# Patient Record
Sex: Female | Born: 1965 | Race: White | Hispanic: No | Marital: Married | Smoking: Never smoker
Health system: Southern US, Community
[De-identification: ages and names within clinical notes are randomized; demographics above are authoritative.]

## PROBLEM LIST (undated history)

## (undated) DIAGNOSIS — I10 Essential (primary) hypertension: Secondary | ICD-10-CM

## (undated) DIAGNOSIS — L509 Urticaria, unspecified: Secondary | ICD-10-CM

## (undated) HISTORY — DX: Urticaria, unspecified: L50.9

## (undated) HISTORY — PX: OTHER SURGICAL HISTORY: SHX169

## (undated) HISTORY — PX: CHOLECYSTECTOMY: SHX55

## (undated) HISTORY — PX: APPENDECTOMY: SHX54

## (undated) HISTORY — DX: Essential (primary) hypertension: I10

---

## 1996-01-03 HISTORY — PX: AUGMENTATION MAMMAPLASTY: SUR837

## 2000-02-09 ENCOUNTER — Other Ambulatory Visit: Admission: RE | Admit: 2000-02-09 | Discharge: 2000-02-09 | Payer: Self-pay | Admitting: Obstetrics & Gynecology

## 2000-05-15 ENCOUNTER — Inpatient Hospital Stay (HOSPITAL_COMMUNITY): Admission: RE | Admit: 2000-05-15 | Discharge: 2000-05-16 | Payer: Self-pay | Admitting: Obstetrics & Gynecology

## 2000-05-15 ENCOUNTER — Encounter (INDEPENDENT_AMBULATORY_CARE_PROVIDER_SITE_OTHER): Payer: Self-pay | Admitting: Specialist

## 2008-12-15 ENCOUNTER — Encounter: Admission: RE | Admit: 2008-12-15 | Discharge: 2008-12-15 | Payer: Self-pay | Admitting: Obstetrics & Gynecology

## 2010-01-24 ENCOUNTER — Encounter
Admission: RE | Admit: 2010-01-24 | Discharge: 2010-01-24 | Payer: Self-pay | Source: Home / Self Care | Attending: Obstetrics & Gynecology | Admitting: Obstetrics & Gynecology

## 2010-05-20 NOTE — Discharge Summary (Signed)
[facility]  Patient:    Heather Bishop, Heather Bishop                     MRN: [id number] Adm. Date:  [id number] Disc. Date: 05/16/00 Attending:  Mickle Mallory                           Discharge Summary  DISCHARGE DIAGNOSES:          1. Symptomatic uterine prolapse.                               2. Menorrhagia.  PROCEDURES:                   Total vaginal hysterectomy.  SUMMARY:                      This patient was admitted after total vaginal hysterectomy which was carried out on May 15, 2000. The procedure was done for menorrhagia and symptomatic uterine prolapse. Operative procedure went without incident. The patients postoperative course was benign. Discharge hemoglobin 11.1 with a white count of 7500 and a platelet count of 219,000. On the evening of May 15, the patient was afebrile, ambulating well, tolerating a regular diet well, having normal bowel and bladder function, was afebrile, and was deemed ready for discharge. Accordingly, she was given all the appropriate instructions and understood instructions well.  DISCHARGE MEDICATIONS:        1. Motrin 600 mg every six hours.                               2. Tylox one to two every four to six hours as                                  needed.  DISCHARGE FOLLOWUP:           She will return to the office for followup in approximately three weeks time.  PATHOLOGY:                    Pathologic specimen was not available at the time of discharge.  CONDITION ON DISCHARGE:       Improved. DD:  05/16/00 TD:  05/16/00 Job: [id number]

## 2010-05-20 NOTE — H&P (Signed)
[facility]  Patient:    Heather Bishop                           MRN: [id number] Adm. Date:  05/15/00 Attending:  Gerrit Friends. Aldona Bar, M.D.                         History and Physical  OFFICE NUMBER                 [id number]  The patient is being admitted after surgery at [facility] on the morning of May 15, 2000.  Please try to have this History & Physical in the Day Surgery Area by 7 a.m. May 15, 2000.  HISTORY:                      Heather Bishop is a 45 year old gravida 3, para 3, married white female admitted with symptomatic prolapse and menorrhagia for a total vaginal hysterectomy.  The patients last menstrual period was April 20.  She has regular cyclic menses, but her menses are characteristically very heavy, and most recently she has had some symptomatic pelvic pressure, especially while exercising and is desirous of surgical correction of her symptomatic prolapse and her menorrhagia.  She is being admitted at this time after a total vaginal hysterectomy.  She had her first delivery by cesarean section in 1990.  At that time, she developed HELLP syndrome.  Her second and third pregnancies were delivered vaginally without difficulty.  Her cervical cytology was normal as of February 2002.  PAST MEDICAL HISTORY:         Negative with the exception of above.  ALLERGIES:                    The patient has no known allergies.  HABITS:                       She is a nonsmoker.  MEDICATIONS:                  She is currently on no medications.  PAST SURGICAL HISTORY:        Surgical procedures includes only the cesarean section in 1990.  SOCIAL HISTORY, FAMILY HISTORY, REVIEW OF SYSTEMS:  Negative with the exception of above.  PHYSICAL EXAMINATION AT TIME OF ADMISSION:  GENERAL:                      Well-developed female, no distress.  VITAL SIGNS:                  Height 5 feet 2 inches, weight 127.  Blood pressure 118/76, temperature  98.2, pulse 80 and regular, respirations 16 and regular.  HEENT:                        Negative.  NECK:                         Thyroid not enlarged.  CHEST:                        Clear.  CARDIOVASCULAR:               Without murmur.  BREASTS:  Negative, implants.  ABDOMEN:                      Unremarkable.  Well-healed cesarean section scar.  PELVIC:                       External genitalia, BUS normal, introitus by digital vault.  There is some first degree to second degree uterine prolapse. Uterus is normal size, mobile.  Cervix has no gross lesions.  Adnexal area negative.  Rectovaginal exam confirmatory.  EXTREMITIES:                  Negative.  NEUROLOGIC:                   Physiologic.  IMPRESSION:                   Symptomatic uterine prolapse and menorrhagia.  PLAN:                         Total vaginal hysterectomy. DD:  05/14/00 TD:  05/14/00 Job: [id number]

## 2010-05-20 NOTE — Op Note (Signed)
[facility]  Patient:    Heather Bishop, Heather Bishop                     MRN: [id number] Proc. Date: 05/15/00 Adm. Date:  [id number] Attending:  Mickle Mallory                           Operative Report  PREOPERATIVE DIAGNOSIS:       Menorrhagia and prolapse.  POSTOPERATIVE DIAGNOSIS:      Menorrhagia and prolapse, pathology pending.  PROCEDURE:                    Total vaginal hysterectomy.  SURGEON:                      Gerrit Friends. Aldona Bar, M.D.  ASSISTANT:                    Luvenia Redden, M.D.  ANESTHESIA:                   General endotracheal.  DESCRIPTION OF PROCEDURE:     The patient was taken to the operating room and, after satisfactory induction of general endotracheal anesthesia, she was prepped and draped, having been placed in the modified lithotomy position.  At this point, the bladder was drained of clear urine with a red rubber catheter in in-and-out fashion.  After the patient was adequately prepped and draped, the procedure was begun.  A weighted speculum was placed in the vagina and a tenaculum placed on the cervix.  The cervix was then circumferentially infiltrated with dilute solution of Neo-Synephrine to aid in hemostasis.  Thereafter, using a scalpel, the cervix was circumscribed and vaginal tissue pushed off the cervi circumferentially.  The posterior peritoneum was identified and entered appropriately.  At this time, the uterosacral pedicles were clamped, cut and sutured superiorly with 0 Vicryl suture.  Additional pedicles were taken in similar fashion above the uterosacrals.  Thereafter, the anterior peritoneum was identified and entered without difficulty.  The uterine artery pedicles were then clamped, cut and sutured superiorly with 0 Vicryl suture. Additional parametrial pedicles were taken bilaterally up to the point where the uterus could be inverted without difficulty.  The uterus was inverted and the ovarian pedicles  were clamped, cut and doubly suture secured with 0 Vicryl sutures.  The specimen was removed.  At this time, all pedicles were investigated.  There was some bleeding above the sutures on the left ovarian pedicle.  These were secured with figure-of-eight 0 Vicryl suture.  Again, the pedicles were inspected and noted to be dry.  At this time, the posterior peritoneum was rendered hemostatic with a running locking suture of 0 Vicryl from uterosacral to uterosacral.  Thereafter, the peritoneum was pursestringed after the bladder was drained of clear urine with a red rubber catheter.  The pursestring was 0 Vicryl suture and essentially closed the peritoneal cavity after peritonealizing all pedicles except the ovarians, which were cut free.  At this time, the vaginal cuff was closed in a vertical fashion with 0 Vicryl suture in interrupted fashion.  Hemostasis was noted to be excellent.  At this time, the bladder was drained again of clear urine with a red rubber catheter and the procedure was felt to be complete and was terminated.  No packs or catheters were left in.  The patient at this time was transported  to the recovery room in satisfactory condition after tolerating the procedure well.  All counts were correct x 2. Both ovaries appeared normal.  The specimen itself had what appeared to be a few myomas, but otherwise was benign.  Further pathologic analysis pending.  At the completion of the procedure, the patient was in satisfactory condition and was taken to recovery in good condition. DD:  05/15/00 TD:  05/15/00 Job: [id number]

## 2011-02-13 ENCOUNTER — Other Ambulatory Visit: Payer: Self-pay | Admitting: Obstetrics & Gynecology

## 2011-02-13 DIAGNOSIS — Z1231 Encounter for screening mammogram for malignant neoplasm of breast: Secondary | ICD-10-CM

## 2011-02-28 ENCOUNTER — Ambulatory Visit
Admission: RE | Admit: 2011-02-28 | Discharge: 2011-02-28 | Disposition: A | Discharge status: Home / Self Care | Payer: Commercial Managed Care - PPO | Source: Ambulatory Visit | Attending: Obstetrics & Gynecology | Admitting: Obstetrics & Gynecology

## 2011-02-28 DIAGNOSIS — Z1231 Encounter for screening mammogram for malignant neoplasm of breast: Secondary | ICD-10-CM

## 2012-03-11 ENCOUNTER — Other Ambulatory Visit: Payer: Self-pay

## 2012-03-11 DIAGNOSIS — Z1231 Encounter for screening mammogram for malignant neoplasm of breast: Secondary | ICD-10-CM

## 2012-04-02 ENCOUNTER — Ambulatory Visit
Admission: RE | Admit: 2012-04-02 | Discharge: 2012-04-02 | Disposition: A | Discharge status: Home / Self Care | Payer: PRIVATE HEALTH INSURANCE | Source: Ambulatory Visit

## 2012-04-02 DIAGNOSIS — Z1231 Encounter for screening mammogram for malignant neoplasm of breast: Secondary | ICD-10-CM

## 2013-09-17 ENCOUNTER — Other Ambulatory Visit: Payer: Self-pay | Admitting: Obstetrics & Gynecology

## 2013-09-17 DIAGNOSIS — R928 Other abnormal and inconclusive findings on diagnostic imaging of breast: Secondary | ICD-10-CM

## 2013-09-24 ENCOUNTER — Ambulatory Visit
Admission: RE | Admit: 2013-09-24 | Discharge: 2013-09-24 | Disposition: A | Discharge status: Home / Self Care | Payer: 59 | Source: Ambulatory Visit | Attending: Obstetrics & Gynecology | Admitting: Obstetrics & Gynecology

## 2013-09-24 ENCOUNTER — Encounter (INDEPENDENT_AMBULATORY_CARE_PROVIDER_SITE_OTHER): Payer: Self-pay

## 2013-09-24 DIAGNOSIS — R928 Other abnormal and inconclusive findings on diagnostic imaging of breast: Secondary | ICD-10-CM

## 2013-11-09 IMAGING — MG MM DIGITAL SCREENING W/ IMPLANTS
9 of 12 series · 9 of 28 positions shown · non-contrast
Comparison: Previous exams.

CLINICAL DATA: Screening.

[L MLO (1 of 2)]
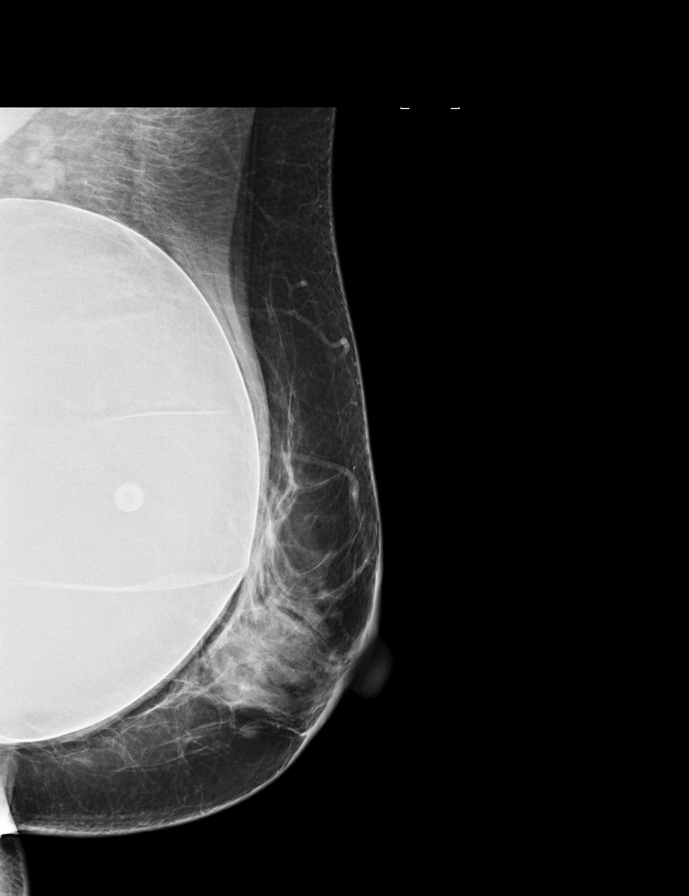

[R MLO (1 of 2)]
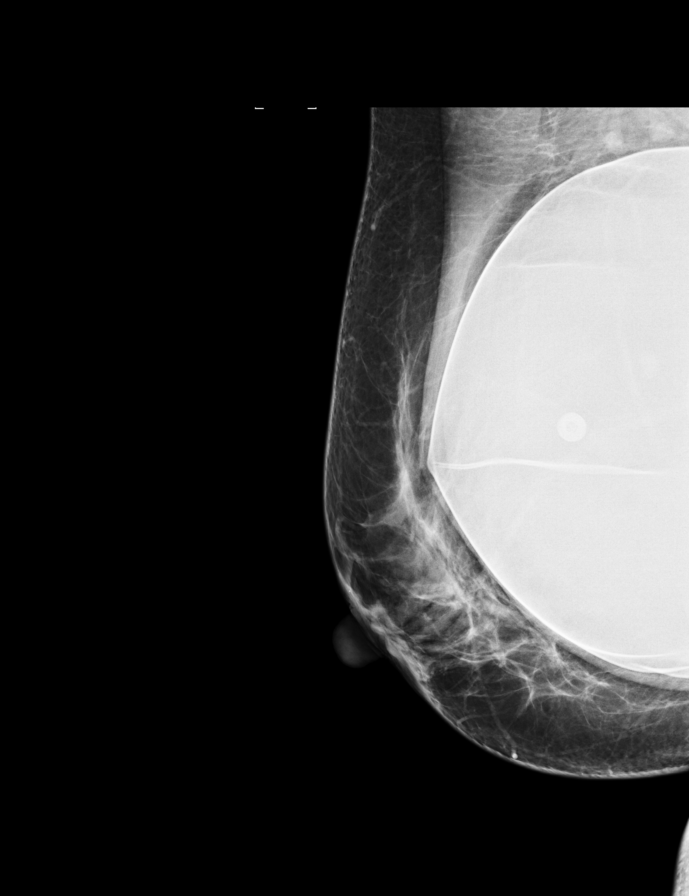

[L CC (1 of 2)]
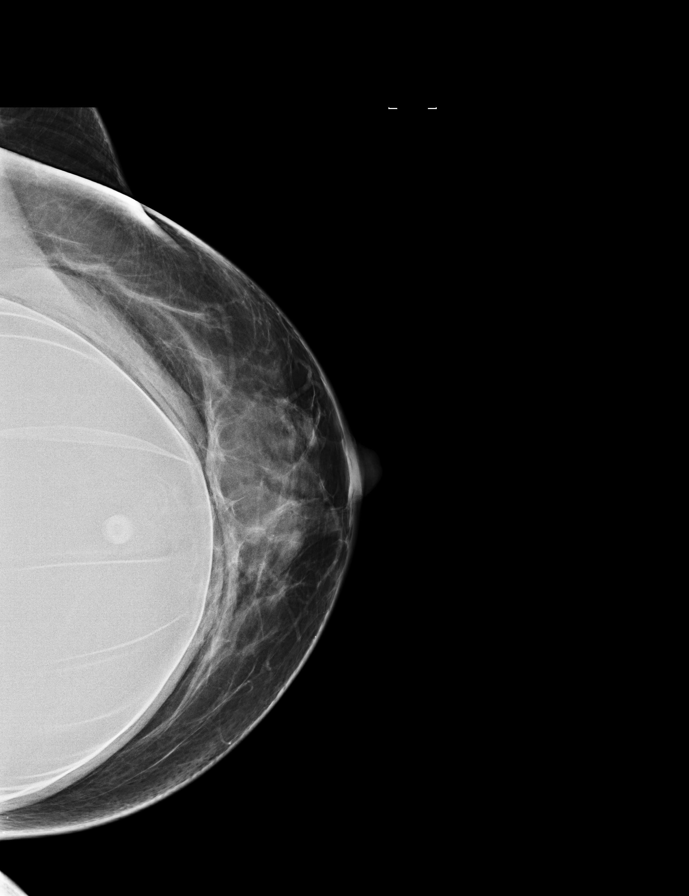

[R CC (1 of 2)]
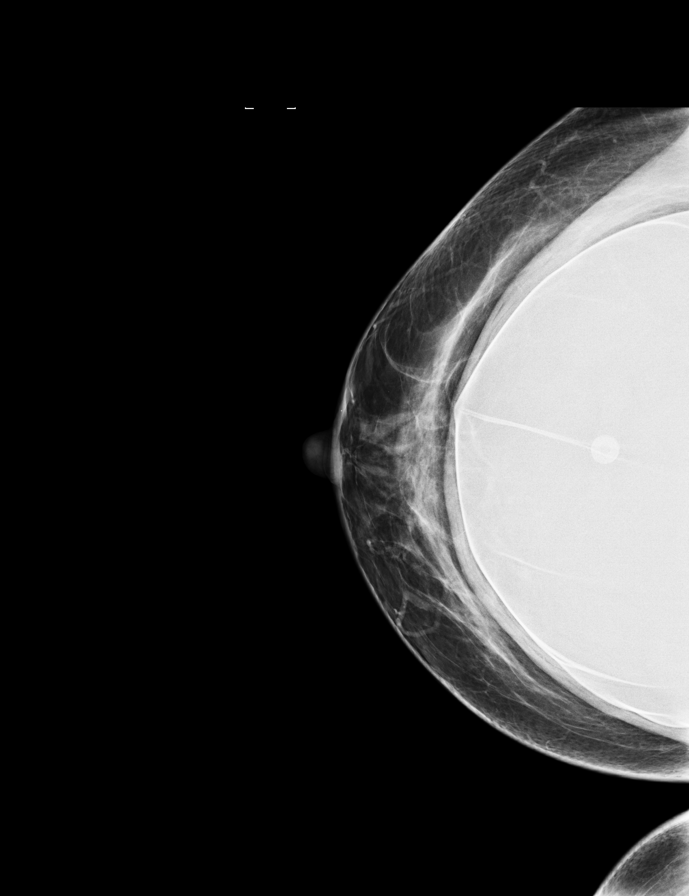

[L MLO (2 of 2)]
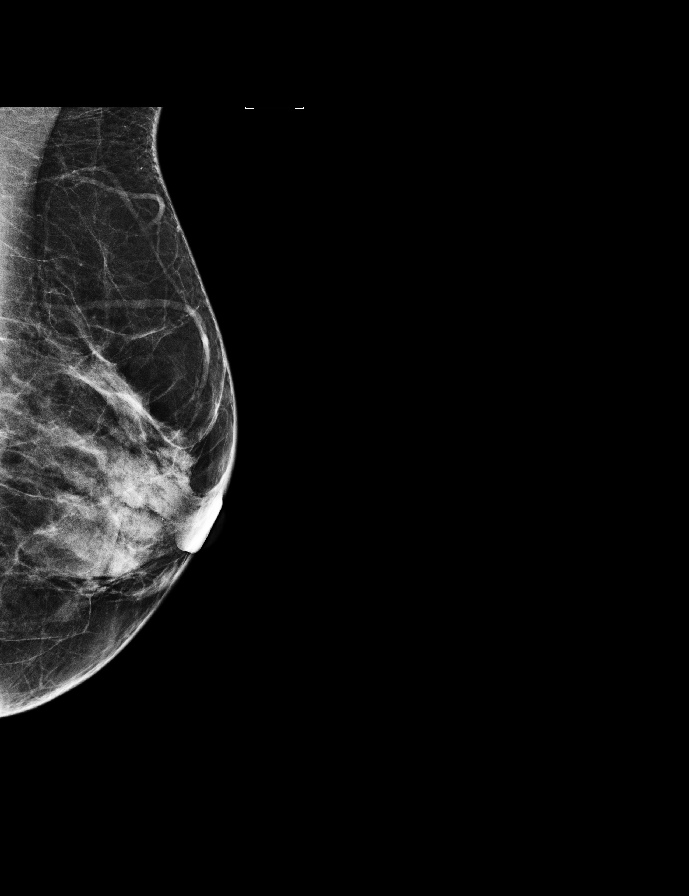

[L CC (2 of 2)]
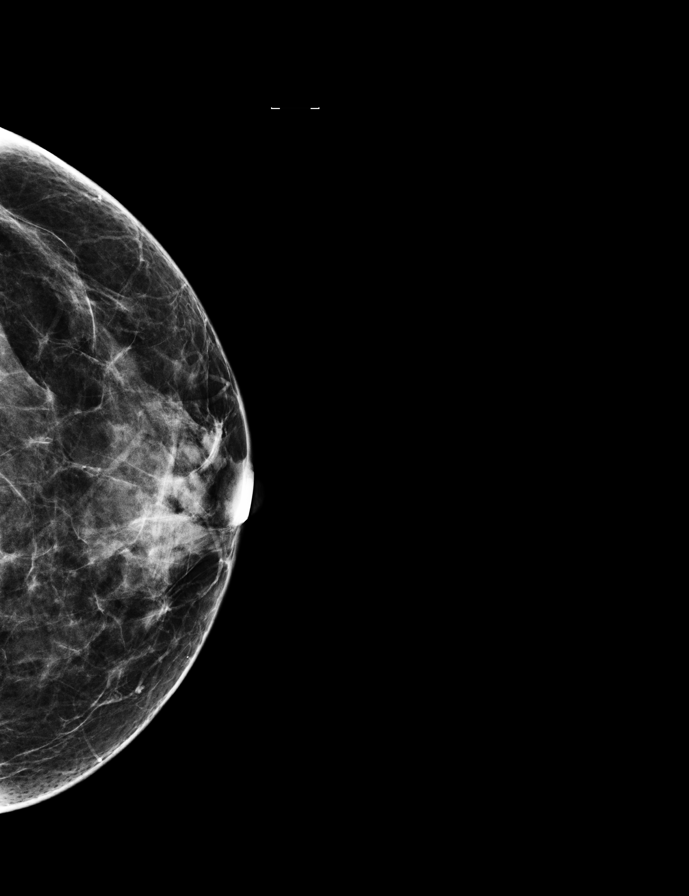

[R MLO (2 of 2)]
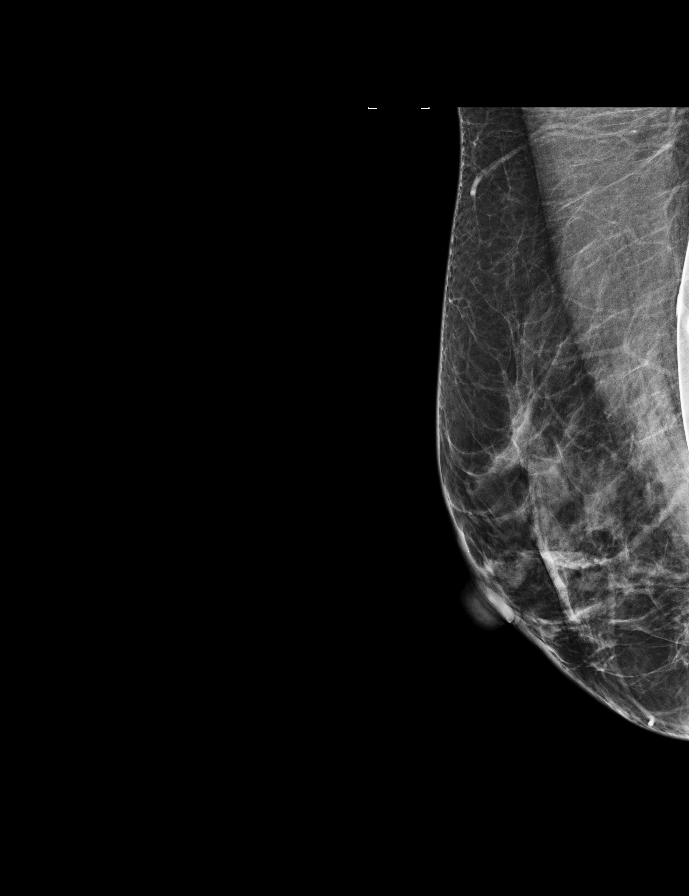

[R CC (2 of 2)]
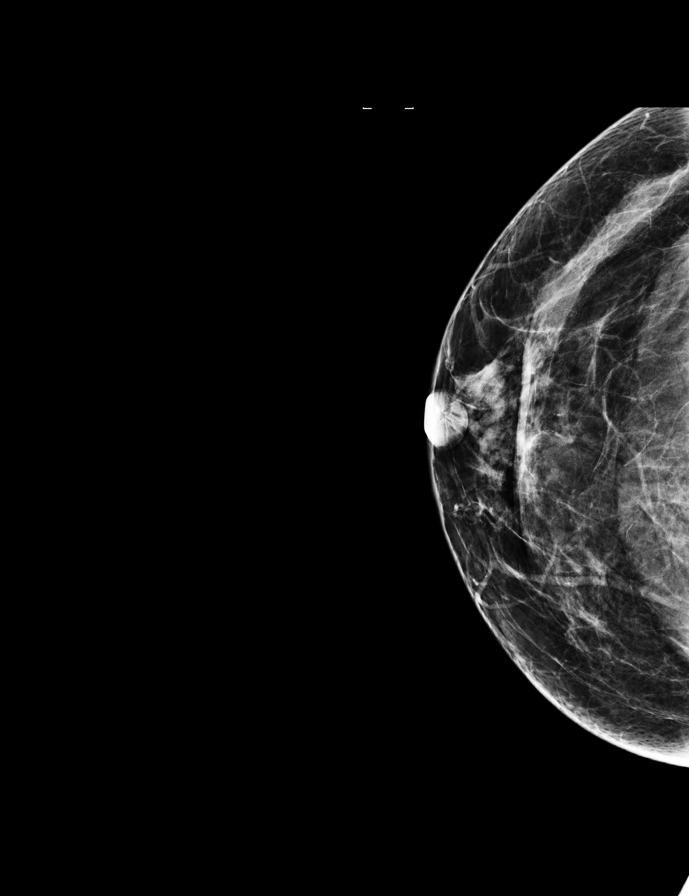

[R MLO tomo · tomo slice 29/56.0]
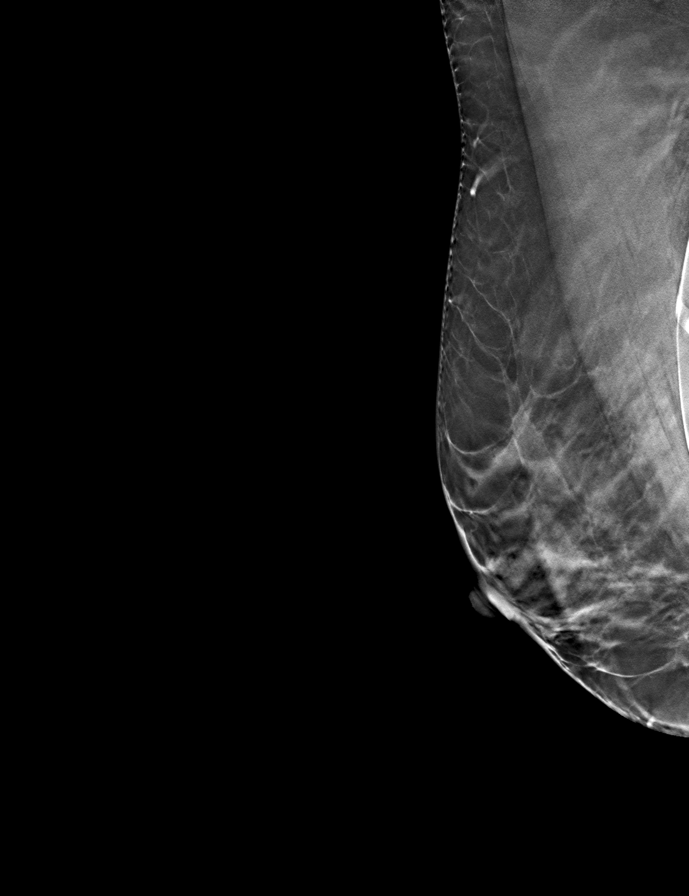

[9 of 28 positions shown; findings below may reference images not displayed]

DIGITAL SCREENING MAMMOGRAM WITH IMPLANTS AND CAD
DIGITAL BREAST TOMOSYNTHESIS

Digital breast tomosynthesis images are acquired in two
projections.  These images are reviewed in combination with the
digital mammogram, confirming the findings below.

The patient has bilateral subpectoral saline implants. Standard and
implant displaced views were performed.
FINDINGS: ACR Breast Density Category 2: There is a scattered fibroglandular
pattern.

No suspicious masses, architectural distortion, or calcifications
are present.

Images were processed with CAD.
IMPRESSION: No mammographic evidence of malignancy.

A result letter of this screening mammogram will be mailed directly
to the patient.

RECOMMENDATION:
Screening mammogram in one year. (Code:NS-M-P7J)

BI-RADS CATEGORY 1:  Negative.

## 2015-05-03 IMAGING — MG MM DIAGNOSTIC UNILATERAL L
2 series · 2 of 2 positions shown · non-contrast
Comparison: 09/15/2013 screening mammogram and prior mammograms
dating back to 12/15/2008

CLINICAL DATA: 48-year-old female with possible left breast
asymmetry on screening mammogram.

EXAM:
DIGITAL DIAGNOSTIC  LEFT MAMMOGRAM WITH CAD

[L CC]
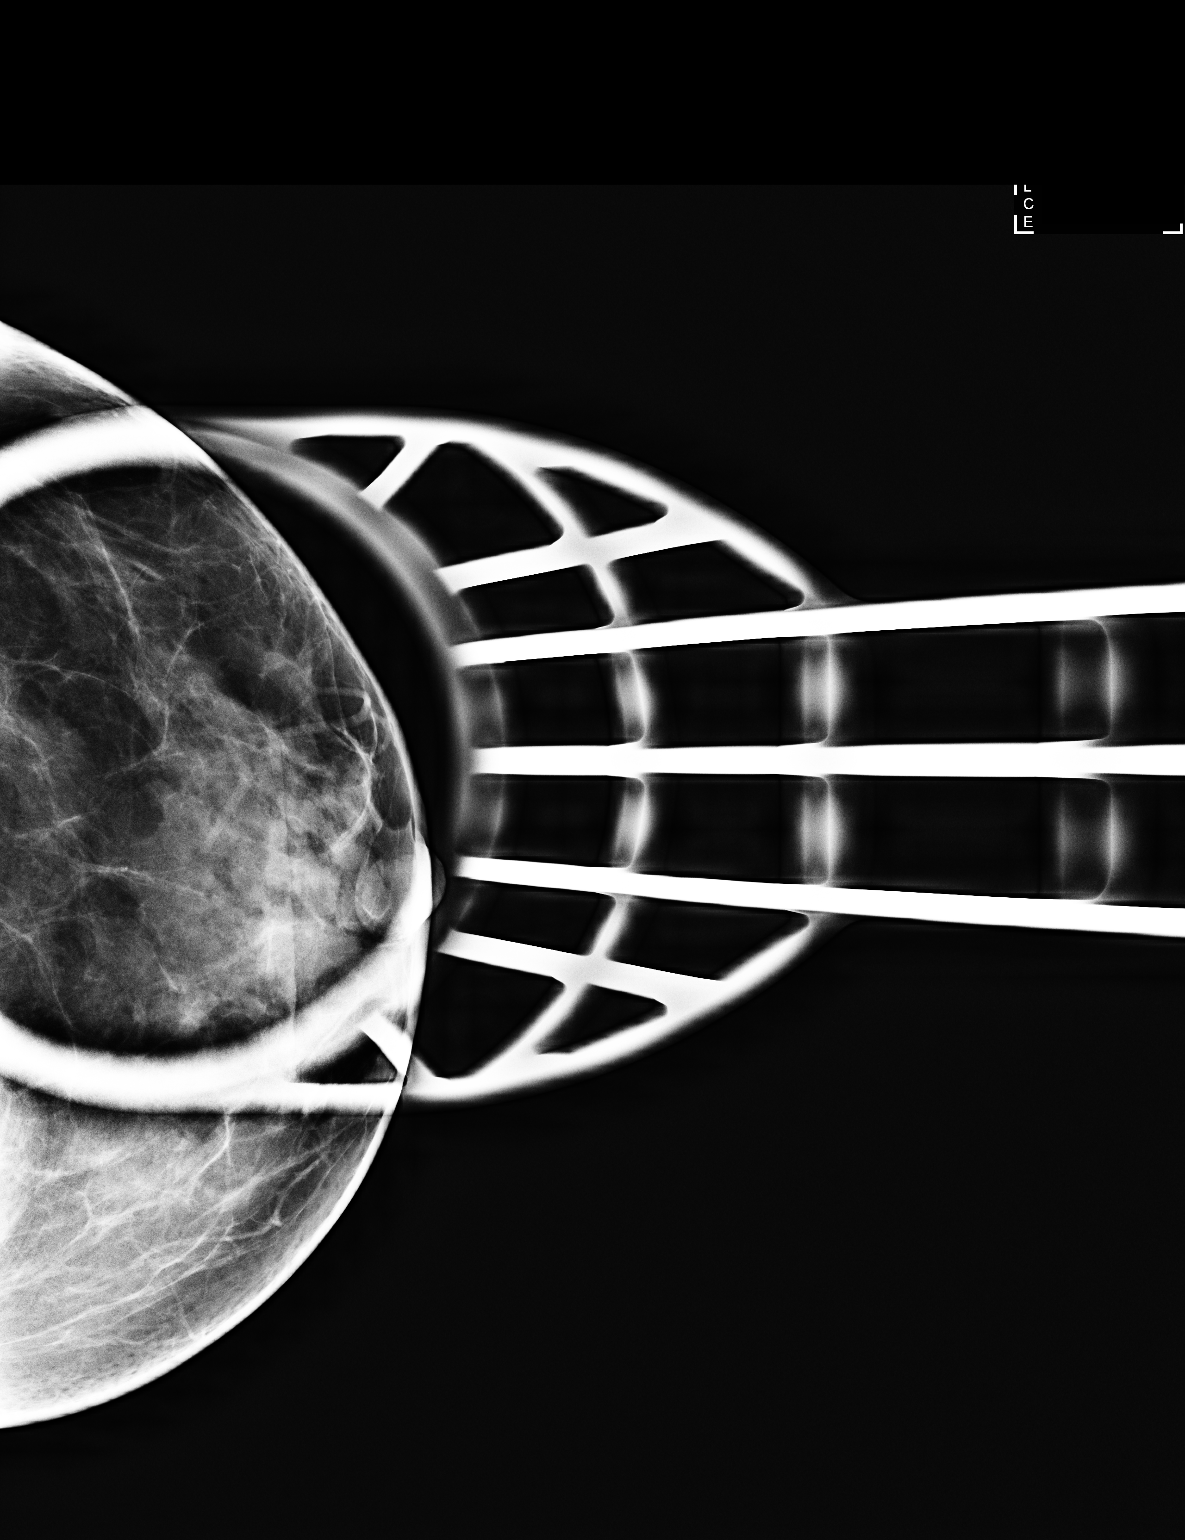

[L ML]
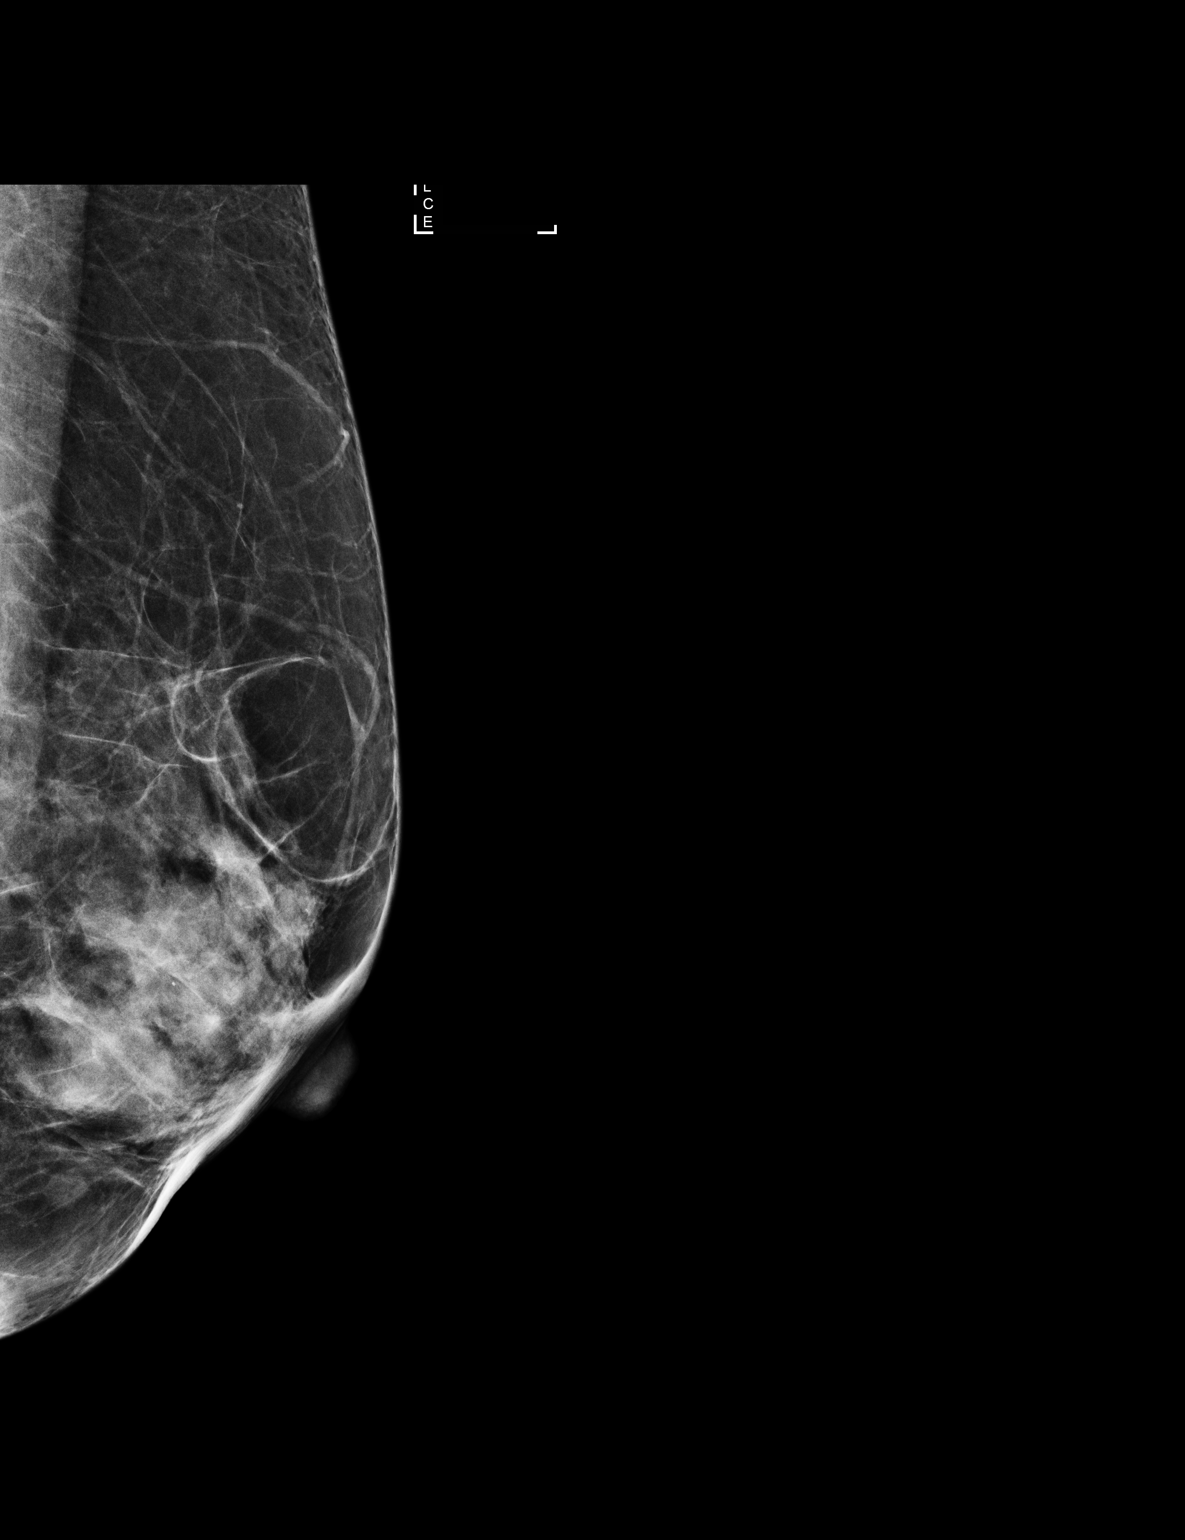

[2 of 2 positions shown; findings below may reference images not displayed]

ACR Breast Density Category c: The breast tissue is heterogeneously
dense, which may obscure small masses.
FINDINGS: CC spot compression and ML views of the left breast demonstrate no
persistent mass, distortion or worsen calcifications in the area of
the screening study finding. No persistent asymmetry is identified.

Mammographic images were processed with CAD.
IMPRESSION: No persistent abnormality in the area of the screening study
finding.

RECOMMENDATION:
Bilateral screening mammograms in 1 year.

I have discussed the findings and recommendations with the patient.
Results were also provided in writing at the conclusion of the
visit. If applicable, a reminder letter will be sent to the patient
regarding the next appointment.

BI-RADS CATEGORY  1: Negative

## 2019-11-05 LAB — COLOGUARD: COLOGUARD: NEGATIVE

## 2021-08-23 ENCOUNTER — Other Ambulatory Visit: Payer: Self-pay | Admitting: Obstetrics & Gynecology

## 2021-08-23 DIAGNOSIS — Z1231 Encounter for screening mammogram for malignant neoplasm of breast: Secondary | ICD-10-CM

## 2021-09-13 ENCOUNTER — Ambulatory Visit
Admission: RE | Admit: 2021-09-13 | Discharge: 2021-09-13 | Disposition: A | Discharge status: Home / Self Care | Payer: No Typology Code available for payment source | Source: Ambulatory Visit | Attending: Obstetrics & Gynecology | Admitting: Obstetrics & Gynecology

## 2021-09-13 DIAGNOSIS — Z1231 Encounter for screening mammogram for malignant neoplasm of breast: Secondary | ICD-10-CM

## 2022-02-01 ENCOUNTER — Encounter: Payer: Self-pay | Admitting: Allergy and Immunology

## 2022-02-01 ENCOUNTER — Ambulatory Visit (INDEPENDENT_AMBULATORY_CARE_PROVIDER_SITE_OTHER): Payer: Managed Care, Other (non HMO) | Admitting: Allergy and Immunology

## 2022-02-01 VITALS — BP 110/76 | HR 71 | Resp 16 | Ht 62.0 in | Wt 146.8 lb

## 2022-02-01 DIAGNOSIS — Z889 Allergy status to unspecified drugs, medicaments and biological substances status: Secondary | ICD-10-CM | POA: Diagnosis not present

## 2022-02-01 DIAGNOSIS — J3089 Other allergic rhinitis: Secondary | ICD-10-CM

## 2022-02-01 DIAGNOSIS — L500 Allergic urticaria: Secondary | ICD-10-CM | POA: Diagnosis not present

## 2022-02-01 DIAGNOSIS — T7840XA Allergy, unspecified, initial encounter: Secondary | ICD-10-CM | POA: Diagnosis not present

## 2022-02-01 DIAGNOSIS — T50905A Adverse effect of unspecified drugs, medicaments and biological substances, initial encounter: Secondary | ICD-10-CM

## 2022-02-01 MED ORDER — EPINEPHRINE 0.3 MG/0.3ML IJ SOAJ: 0.3000 mg | INTRAMUSCULAR | 1 refills | Status: AC | PRN

## 2022-02-01 NOTE — Patient Instructions (Addendum)
  1.  Allergen avoidance measures???  2.  Continue famotidine 40 mg + cetirizine 10 mg 1 time per day  3.  EpiPen, Benadryl, MD/ER evaluation for allergic reaction  4.  Blood -alpha gal panel  5.  Further evaluation?  Yes, if with recurrent reactions  6.  Can use nasal steroid preventatively + antihistamine for nasal allergies  7.  Can use nasal saline during viral respiratory tract infections  8.  Return to clinic in 1 year or earlier if problem  9. Remain away from "Floxins"

## 2022-02-01 NOTE — Progress Notes (Unsigned)
[place] [facility] [place] [facility] [place] [facility] [place]   Dear Heather Bishop,  Thank you for referring Heather Bishop to the [facility] on 02/01/2022.   Below is a summation of this patient's evaluation and recommendations.  Thank you for your referral. I will keep you informed about this patient's response to treatment.   If you have any questions please do not hesitate to contact me.   Sincerely,  Heather Prows, MD Allergy / Immunology [place]   ______________________________________________________________________    NEW PATIENT NOTE  Referring Provider: Janalyn Rouse, FNP Primary Provider: Greig Right, MD Date of office visit: 02/01/2022    Subjective:   Chief Complaint:  Heather Bishop (DOB: 1965-12-24) is a 57 y.o. female who presents to the clinic on 02/01/2022 with a chief complaint of Urticaria .     HPI: Heather Bishop to this clinic in evaluation of her urticaria.  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  These episodes will last about 4 days and when they heal they heal without any scar hyperpigmentation.  They are very intermittent and she can go a month without these issues.  They were becoming more progressive but about 3 months ago she started a combination of famotidine and Zyrtec which really works very well and since that point time she has not had any recurrent episodes of urticaria.  However, about 3 weeks ago she had a viral respiratory tract infection that may have been complicated by otitis media and she was given moxifloxacin within 4 hours of that initial pill she developed very severe lip and eye swelling and some throat tightness and hives then she went to the emergency room and received epinephrine.  This reaction associated with  taking moxifloxacin was very unusual compared to her previous episodes.  She also has a history of nasal congestion and sneezing and eye involvement throughout the year with predilection for winter flare and occasionally spring flare.  She will take Zyrtec which does help.  She gets a sinus infection about 1 time per year.  She has ankylosing spondylitis/spondyloarthropathy treated by [facility] rheumatology currently with anti-TNF monoclonal antibody.  She has been utilizing some form of immunosuppressive drugs since 2020 with good results.  Past Medical History:  Diagnosis Date   Hypertension    Urticaria     Past Surgical History:  Procedure Laterality Date   APPENDECTOMY     AUGMENTATION MAMMAPLASTY Bilateral 1998   CHOLECYSTECTOMY     Bishop knee arthroscopy      Allergies as of 02/01/2022       Reactions   Penicillins Anaphylaxis        Medication List    cetirizine 10 MG tablet Commonly known as: ZYRTEC Take 10 mg by mouth daily.   estradiol 2 MG tablet Commonly known as: ESTRACE Take 2 mg by mouth daily.   famotidine 40 MG tablet Commonly known as: PEPCID Take 40 mg by mouth daily.   losartan 50 MG tablet Commonly known as: COZAAR Take 50 mg by mouth daily.   Multi Vitamin Tabs 1 tablet Orally Once a day for 30 day(s)   pantoprazole 40 MG tablet Commonly known as: PROTONIX Take 40 mg by mouth daily.   rosuvastatin 20 MG tablet Commonly known as: CRESTOR  Take 20 mg by mouth daily.   Simponi Aria 50 MG/4ML Soln injection Generic drug: golimumab Simponi ARIA 12.5 mg/mL intravenous solution   triamterene-hydrochlorothiazide 37.5-25 MG tablet Commonly known as: MAXZIDE-25 Take 1 tablet by mouth daily.   Valtrex 1000 MG tablet Generic drug: valACYclovir Take 1,000 mg by mouth 2 (two) times daily. Takes when she gets dental work done    Review of systems negative except as noted in HPI / PMHx or noted below:  Review of Systems  Constitutional:  Negative.   HENT: Negative.    Eyes: Negative.   Respiratory: Negative.    Cardiovascular: Negative.   Gastrointestinal: Negative.   Genitourinary: Negative.   Musculoskeletal: Negative.   Skin: Negative.   Neurological: Negative.   Endo/Heme/Allergies: Negative.   Psychiatric/Behavioral: Negative.      History reviewed. No pertinent family history.  Social History   Socioeconomic History   Marital status: Married    Spouse name: Not on file   Number of children: Not on file   Years of education: Not on file   Highest education level: Not on file  Occupational History   Not on file  Tobacco Use   Smoking status: Never   Smokeless tobacco: Never  Substance and Sexual Activity   Alcohol use: Never   Drug use: Not on file   Sexual activity: Not on file  Other Topics Concern   Not on file  Social History Narrative   Not on file   Environmental and Social history  Lives in a house with a dry environment, no animals located inside the household, no carpet in the bedroom, no plastic on the bed, no plastic on the pillow, no smoking ongoing with inside the household.  Objective:   Vitals:   02/01/22 1422  BP: 110/76  Pulse: 71  Resp: 16  SpO2: 97%   Height: 5\' 2"  (157.5 cm) Weight: 146 lb 12.8 oz (66.6 kg)  Physical Exam Constitutional:      Appearance: She is not diaphoretic.  HENT:     Head: Normocephalic.     Bishop Ear: Tympanic membrane, ear canal and external ear normal.     Left Ear: Tympanic membrane, ear canal and external ear normal.     Nose: Nose normal. No mucosal edema or rhinorrhea.     Mouth/Throat:     Pharynx: Uvula midline. No oropharyngeal exudate.  Eyes:     Conjunctiva/sclera: Conjunctivae normal.  Neck:     Thyroid: No thyromegaly.     Trachea: Trachea normal. No tracheal tenderness or tracheal deviation.  Cardiovascular:     Rate and Rhythm: Normal rate and regular rhythm.     Heart sounds: Normal heart sounds, S1 normal and S2  normal. No murmur heard. Pulmonary:     Effort: No respiratory distress.     Breath sounds: Normal breath sounds. No stridor. No wheezing or rales.  Lymphadenopathy:     Head:     Bishop side of head: No tonsillar adenopathy.     Left side of head: No tonsillar adenopathy.     Cervical: No cervical adenopathy.  Skin:    Findings: No erythema or rash.     Nails: There is no clubbing.  Neurological:     Mental Status: She is alert.     Diagnostics: Allergy skin tests were performed.   Spirometry was performed and demonstrated an FEV1 of *** @ *** % of predicted. FEV1/FVC = ***  The patient had an Asthma Control Test with  the following results:  .     Assessment and Plan:    1. Allergic urticaria   2. Allergic reaction, initial encounter     Patient Instructions    1.  Allergen avoidance measures???  2.  Continue famotidine 40 mg + cetirizine 10 mg 1 time per day  3.  EpiPen, Benadryl, MD/ER evaluation for allergic reaction  4.  Blood -alpha gal panel  5.  Further evaluation?  Yes, if with recurrent reactions  6.  Can use nasal steroid preventatively + antihistamine for nasal allergies  7.  Can use nasal saline during viral respiratory tract infections  8.  Return to clinic in 1 year or earlier if problem  9. Remain away from "Floxins"   Heather Prows, MD Allergy / Immunology [place] [facility] [place]

## 2022-02-02 ENCOUNTER — Encounter: Payer: Self-pay | Admitting: Allergy and Immunology

## 2022-08-31 ENCOUNTER — Other Ambulatory Visit: Payer: Self-pay | Admitting: Obstetrics & Gynecology

## 2022-08-31 DIAGNOSIS — Z1231 Encounter for screening mammogram for malignant neoplasm of breast: Secondary | ICD-10-CM

## 2022-09-19 ENCOUNTER — Ambulatory Visit
Admission: RE | Admit: 2022-09-19 | Discharge: 2022-09-19 | Disposition: A | Discharge status: Home / Self Care | Payer: 59 | Source: Ambulatory Visit | Attending: Obstetrics & Gynecology | Admitting: Obstetrics & Gynecology

## 2022-09-19 DIAGNOSIS — Z1231 Encounter for screening mammogram for malignant neoplasm of breast: Secondary | ICD-10-CM

## 2022-11-13 LAB — COLOGUARD: COLOGUARD: NEGATIVE

## 2023-09-06 ENCOUNTER — Other Ambulatory Visit: Payer: Self-pay | Admitting: Obstetrics & Gynecology

## 2023-09-06 DIAGNOSIS — Z1231 Encounter for screening mammogram for malignant neoplasm of breast: Secondary | ICD-10-CM

## 2023-09-26 ENCOUNTER — Other Ambulatory Visit: Payer: Self-pay | Admitting: Obstetrics & Gynecology

## 2023-09-26 ENCOUNTER — Ambulatory Visit
Admission: RE | Admit: 2023-09-26 | Discharge: 2023-09-26 | Disposition: A | Discharge status: Home / Self Care | Source: Ambulatory Visit | Attending: Obstetrics & Gynecology | Admitting: Obstetrics & Gynecology

## 2023-09-26 DIAGNOSIS — Z1231 Encounter for screening mammogram for malignant neoplasm of breast: Secondary | ICD-10-CM

## 2024-01-11 ENCOUNTER — Encounter (HOSPITAL_BASED_OUTPATIENT_CLINIC_OR_DEPARTMENT_OTHER): Payer: Self-pay
